# Patient Record
Sex: Female | Born: 1985 | Race: White | Hispanic: No | Marital: Married | Smoking: Never smoker
Health system: Southern US, Community
[De-identification: ages and names within clinical notes are randomized; demographics above are authoritative.]

---

## 2012-02-15 ENCOUNTER — Ambulatory Visit (INDEPENDENT_AMBULATORY_CARE_PROVIDER_SITE_OTHER): Payer: Managed Care, Other (non HMO) | Admitting: General Surgery

## 2012-02-15 ENCOUNTER — Encounter (INDEPENDENT_AMBULATORY_CARE_PROVIDER_SITE_OTHER): Payer: Self-pay | Admitting: General Surgery

## 2012-02-15 VITALS — BP 112/68 | HR 76 | Temp 97.9°F | Resp 20 | Ht 62.0 in | Wt 132.2 lb

## 2012-02-15 DIAGNOSIS — R599 Enlarged lymph nodes, unspecified: Secondary | ICD-10-CM

## 2012-02-15 DIAGNOSIS — R590 Localized enlarged lymph nodes: Secondary | ICD-10-CM

## 2012-02-15 MED ORDER — DOXYCYCLINE HYCLATE 100 MG PO TABS: 100.0000 mg | ORAL_TABLET | Freq: Two times a day (BID) | ORAL | Status: AC

## 2012-02-15 NOTE — Patient Instructions (Signed)
Call in 2 weeks.  If node is still present then call and we will get ultrasound prior to return. If gone will see in office again.

## 2012-02-16 NOTE — Progress Notes (Signed)
Subjective:     Patient ID: Autumn Porter Luster, female   DOB: 1986-05-17, 26 y.o.   MRN: [id number]  HPI 26 yo healthy female who presents with over a one month history of right sided cervical adenopathy.  She was bitten by a tick in the posterior neck right before she noticed this. The tick site is still irritating to her also.  She then felt a couple small nodes in her right neck. She was seen by family medicine (she did not relay to them about the tick bite) and started on augmentin with no improvement.  She does not have any other symptoms including weight loss, loss of appetite, fevers or night sweats.  She was then referred for discussion about possible biopsy.   Review of Systems  Constitutional: Negative for fever, chills and unexpected weight change.  HENT: Negative for hearing loss, congestion, sore throat, trouble swallowing and voice change.   Eyes: Negative for visual disturbance.  Respiratory: Negative for cough and wheezing.   Cardiovascular: Negative for chest pain, palpitations and leg swelling.  Gastrointestinal: Negative for nausea, vomiting, abdominal pain, diarrhea, constipation, blood in stool, abdominal distention and anal bleeding.  Genitourinary: Negative for hematuria, vaginal bleeding and difficulty urinating.  Musculoskeletal: Negative for arthralgias.  Skin: Negative for rash and wound.  Neurological: Negative for seizures, syncope and headaches.  Hematological: Negative for adenopathy. Does not bruise/bleed easily.  Psychiatric/Behavioral: Negative for confusion.       Objective:   Physical Exam  Vitals reviewed. Constitutional: She appears well-developed and well-nourished.  Neck: Neck supple.    Lymphadenopathy:    She has cervical adenopathy.       Assessment:     Tick bite Cervical adenopathy    Plan:     I think historically this is likely related to tick bite.  There is no other evidence this is anything more and I think the nodes are pretty small  (although palpable) in a thin lady.  I think treatment with doxy and then follow up would be best course and she is comfortable with this.

## 2012-03-03 ENCOUNTER — Telehealth (INDEPENDENT_AMBULATORY_CARE_PROVIDER_SITE_OTHER): Payer: Self-pay

## 2012-03-03 NOTE — Telephone Encounter (Signed)
Called pt to let her know that I did speak with Dr Dwain Sarna about the lymph node that she is still feeling after completing the round of Doxycycline. Per Dr Dwain Sarna he recommended pt coming back to see in a couple of weeks. The pt really wants to wait the couple of weeks out and then call to make an appt if not any better. I advised the pt that would be ok to just call me if no change with the area.

## 2012-03-03 NOTE — Telephone Encounter (Signed)
The patient called back to report she finished her antibiotics yesterday and the knot is still there.  She thinks she is supposed to get an ultrasound.  You can call her cell and she is at work so she may not pick up right away.

## 2014-07-12 LAB — OB RESULTS CONSOLE GC/CHLAMYDIA
Chlamydia: NEGATIVE
Gonorrhea: NEGATIVE

## 2014-08-23 LAB — OB RESULTS CONSOLE ABO/RH: RH Type: NEGATIVE

## 2014-08-23 LAB — OB RESULTS CONSOLE RUBELLA ANTIBODY, IGM: Rubella: IMMUNE

## 2014-08-23 LAB — OB RESULTS CONSOLE HIV ANTIBODY (ROUTINE TESTING): HIV: NONREACTIVE

## 2014-08-23 LAB — OB RESULTS CONSOLE RPR: RPR: NONREACTIVE

## 2014-08-23 LAB — OB RESULTS CONSOLE HEPATITIS B SURFACE ANTIGEN: Hepatitis B Surface Ag: NEGATIVE

## 2014-08-23 LAB — OB RESULTS CONSOLE ANTIBODY SCREEN: Antibody Screen: NEGATIVE

## 2015-01-31 LAB — OB RESULTS CONSOLE GBS: STREP GROUP B AG: NEGATIVE

## 2015-03-06 ENCOUNTER — Inpatient Hospital Stay (HOSPITAL_COMMUNITY): Payer: BLUE CROSS/BLUE SHIELD

## 2015-03-06 ENCOUNTER — Inpatient Hospital Stay (HOSPITAL_COMMUNITY)
Admission: AD | Admit: 2015-03-06 | Discharge: 2015-03-06 | Disposition: A | Discharge status: Home / Self Care | Payer: BLUE CROSS/BLUE SHIELD | Source: Ambulatory Visit | Attending: Obstetrics and Gynecology | Admitting: Obstetrics and Gynecology

## 2015-03-06 ENCOUNTER — Encounter (HOSPITAL_COMMUNITY): Payer: Self-pay | Admitting: *Deleted

## 2015-03-06 DIAGNOSIS — O368130 Decreased fetal movements, third trimester, not applicable or unspecified: Secondary | ICD-10-CM | POA: Insufficient documentation

## 2015-03-06 DIAGNOSIS — O368131 Decreased fetal movements, third trimester, fetus 1: Secondary | ICD-10-CM | POA: Diagnosis not present

## 2015-03-06 DIAGNOSIS — Z3A40 40 weeks gestation of pregnancy: Secondary | ICD-10-CM | POA: Diagnosis not present

## 2015-03-06 NOTE — Discharge Instructions (Signed)

## 2015-03-06 NOTE — MAU Provider Note (Signed)
  History     CSN: [id number]  Arrival date and time: 03/06/15 1725   First Provider Initiated Contact with Patient 03/06/15 1811      Chief Complaint  Patient presents with  . Non-stress Test   HPI Comments: Autumn Porter is a 29 y.o. G1P0 at [redacted]w[redacted]d sent from office after nonreactive NST. She reports decreased fetal movement for 2 days. Now she does perceive fetal movement. Aware of mild intermittent contractions. Denies leakage of fluid or vaginal bleeding. Prenatal course has been essentially uncomplicated.       History reviewed. No pertinent past medical history.  History reviewed. No pertinent past surgical history.  Family History  Problem Relation Age of Onset  . Cancer Paternal Grandmother     breast    Social History  Substance Use Topics  . Smoking status: Never Smoker   . Smokeless tobacco: Never Used  . Alcohol Use: 2.0 oz/week    4 Standard drinks or equivalent per week    Allergies: No Known Allergies  No prescriptions prior to admission    Review of Systems  Constitutional: Negative for fever.  Gastrointestinal: Negative for nausea, vomiting and abdominal pain.  Genitourinary: Negative for dysuria and urgency.  Neurological: Negative for dizziness and headaches.  Psychiatric/Behavioral: The patient is not nervous/anxious.    Physical Exam   Filed Vitals:   03/06/15 1958  BP: 114/79  Pulse: 82  Temp: 97.6 F (36.4 C)  TempSrc: Oral  Resp: 20  SpO2: 100%    Physical Exam  Constitutional: She is oriented to person, place, and time. She appears well-developed and well-nourished.  Neck: Normal range of motion. Neck supple.  Cardiovascular: Normal rate.   Respiratory: Effort normal.  GI: Soft.  Genitourinary:  Exam deferred. States last VE 1 wk ago: 1 cm.   Musculoskeletal: She exhibits no edema.  Neurological: She is alert and oriented to person, place, and time.  Skin: Skin is warm and dry.  Psychiatric: She has a normal mood and  affect. Her behavior is normal. Thought content normal.    MAU Course  Procedures   [place] for AFI: prelim report AFI 15 No results found for this or any previous visit (from the past 24 hour(s)). MDM C/W Dr. Tenny Craw  Assessment and Plan  G1 at 40wk Reassuring fetal testing 1. [redacted] weeks gestation of pregnancy   2. Decreased fetal movement, third trimester, fetus 1      Medication List    TAKE these medications        famotidine 20 MG tablet  Commonly known as:  PEPCID  Take 20 mg by mouth daily.     metroNIDAZOLE 500 MG tablet  Commonly known as:  FLAGYL  Take 500 mg by mouth 2 (two) times daily. Take 1 tablet by mouth 2 times a day for seven days.     prenatal multivitamin Tabs tablet  Take 1 tablet by mouth daily at 12 noon.       Follow-up Information    Follow up with Levi Aland, MD.   Specialty:  Obstetrics and Gynecology   Why:  Keep your scheduled prenatal appointment   Contact information:   [address] [place] [address] [phone]       POE,DEIRDRE 03/06/2015, 6:19 PM the patient is sent in a prescription for herhe also never done before that

## 2015-03-06 NOTE — MAU Note (Signed)
Pt sent in for NST from office for post dates. Nonreactive in office. Stated baby had not been moving as much for the past few days.

## 2015-03-13 ENCOUNTER — Inpatient Hospital Stay (HOSPITAL_COMMUNITY): Payer: BLUE CROSS/BLUE SHIELD | Admitting: Anesthesiology

## 2015-03-13 ENCOUNTER — Inpatient Hospital Stay (HOSPITAL_COMMUNITY)
Admission: RE | Admit: 2015-03-13 | Discharge: 2015-03-16 | DRG: 766 | Disposition: A | Discharge status: Home / Self Care | Payer: BLUE CROSS/BLUE SHIELD | Source: Ambulatory Visit | Attending: Obstetrics and Gynecology | Admitting: Obstetrics and Gynecology

## 2015-03-13 ENCOUNTER — Encounter (HOSPITAL_COMMUNITY): Payer: Self-pay | Admitting: *Deleted

## 2015-03-13 DIAGNOSIS — O9962 Diseases of the digestive system complicating childbirth: Secondary | ICD-10-CM | POA: Diagnosis present

## 2015-03-13 DIAGNOSIS — Z3490 Encounter for supervision of normal pregnancy, unspecified, unspecified trimester: Secondary | ICD-10-CM

## 2015-03-13 DIAGNOSIS — Z3A40 40 weeks gestation of pregnancy: Secondary | ICD-10-CM | POA: Diagnosis present

## 2015-03-13 DIAGNOSIS — K219 Gastro-esophageal reflux disease without esophagitis: Secondary | ICD-10-CM | POA: Diagnosis present

## 2015-03-13 LAB — CBC
HCT: 39 % (ref 36.0–46.0)
Hemoglobin: 13.6 g/dL (ref 12.0–15.0)
MCH: 31.3 pg (ref 26.0–34.0)
MCHC: 34.9 g/dL (ref 30.0–36.0)
MCV: 89.9 fL (ref 78.0–100.0)
Platelets: 186 K/uL (ref 150–400)
RBC: 4.34 MIL/uL (ref 3.87–5.11)
RDW: 14.2 % (ref 11.5–15.5)
WBC: 10.1 K/uL (ref 4.0–10.5)

## 2015-03-13 LAB — SYPHILIS: RPR W/REFLEX TO RPR TITER AND TREPONEMAL ANTIBODIES, TRADITIONAL SCREENING AND DIAGNOSIS ALGORITHM: RPR Ser Ql: NONREACTIVE

## 2015-03-13 MED ORDER — ACETAMINOPHEN 325 MG PO TABS: 650.0000 mg | ORAL_TABLET | ORAL | Status: DC | PRN

## 2015-03-13 MED ORDER — OXYTOCIN 40 UNITS IN LACTATED RINGERS INFUSION - SIMPLE MED
1.0000 m[IU]/min | INTRAVENOUS | Status: DC
Start: 2015-03-13 — End: 2015-03-13
  Administered 2015-03-13: 2 m[IU]/min via INTRAVENOUS
  Filled 2015-03-13: qty 1000

## 2015-03-13 MED ORDER — OXYTOCIN BOLUS FROM INFUSION: 500.0000 mL | INTRAVENOUS | Status: DC

## 2015-03-13 MED ORDER — LIDOCAINE HCL (PF) 1 % IJ SOLN: 30.0000 mL | INTRAMUSCULAR | Status: DC | PRN

## 2015-03-13 MED ORDER — FLEET ENEMA 7-19 GM/118ML RE ENEM: 1.0000 | ENEMA | RECTAL | Status: DC | PRN

## 2015-03-13 MED ORDER — OXYTOCIN 40 UNITS IN LACTATED RINGERS INFUSION - SIMPLE MED: 1.0000 m[IU]/min | INTRAVENOUS | Status: DC

## 2015-03-13 MED ORDER — PHENYLEPHRINE 40 MCG/ML (10ML) SYRINGE FOR IV PUSH (FOR BLOOD PRESSURE SUPPORT)
80.0000 ug | PREFILLED_SYRINGE | INTRAVENOUS | Status: AC | PRN
  Administered 2015-03-14: 40 ug via INTRAVENOUS
  Administered 2015-03-14 (×2): 80 ug via INTRAVENOUS
  Administered 2015-03-14: 40 ug via INTRAVENOUS
  Administered 2015-03-14 (×2): 80 ug via INTRAVENOUS
  Administered 2015-03-14: 40 ug via INTRAVENOUS
  Administered 2015-03-14 (×2): 80 ug via INTRAVENOUS
  Administered 2015-03-14: 40 ug via INTRAVENOUS
  Administered 2015-03-14 (×3): 80 ug via INTRAVENOUS

## 2015-03-13 MED ORDER — ONDANSETRON HCL 4 MG/2ML IJ SOLN: 4.0000 mg | Freq: Four times a day (QID) | INTRAMUSCULAR | Status: DC | PRN

## 2015-03-13 MED ORDER — PHENYLEPHRINE 40 MCG/ML (10ML) SYRINGE FOR IV PUSH (FOR BLOOD PRESSURE SUPPORT)
PREFILLED_SYRINGE | INTRAVENOUS | Status: AC
  Filled 2015-03-13: qty 20

## 2015-03-13 MED ORDER — SODIUM CHLORIDE 0.9 % IJ SOLN: 3.0000 mL | Freq: Two times a day (BID) | INTRAMUSCULAR | Status: DC

## 2015-03-13 MED ORDER — LACTATED RINGERS IV SOLN
500.0000 mL | INTRAVENOUS | Status: DC | PRN
  Administered 2015-03-13 (×4): 500 mL via INTRAVENOUS

## 2015-03-13 MED ORDER — SODIUM CHLORIDE 0.9 % IV SOLN: 250.0000 mL | INTRAVENOUS | Status: DC | PRN

## 2015-03-13 MED ORDER — FENTANYL 2.5 MCG/ML BUPIVACAINE 1/10 % EPIDURAL INFUSION (WH - ANES)
14.0000 mL/h | INTRAMUSCULAR | Status: DC | PRN
  Administered 2015-03-13 – 2015-03-14 (×3): 14 mL/h via EPIDURAL
  Filled 2015-03-13: qty 125

## 2015-03-13 MED ORDER — DIPHENHYDRAMINE HCL 50 MG/ML IJ SOLN: 12.5000 mg | INTRAMUSCULAR | Status: DC | PRN

## 2015-03-13 MED ORDER — BUTORPHANOL TARTRATE 1 MG/ML IJ SOLN
1.0000 mg | INTRAMUSCULAR | Status: DC | PRN
Start: 2015-03-13 — End: 2015-03-14
  Administered 2015-03-13: 1 mg via INTRAVENOUS
  Filled 2015-03-13: qty 1

## 2015-03-13 MED ORDER — SODIUM CHLORIDE 0.9 % IJ SOLN: 3.0000 mL | INTRAMUSCULAR | Status: DC | PRN

## 2015-03-13 MED ORDER — OXYCODONE-ACETAMINOPHEN 5-325 MG PO TABS: 1.0000 | ORAL_TABLET | ORAL | Status: DC | PRN

## 2015-03-13 MED ORDER — LIDOCAINE HCL (PF) 1 % IJ SOLN
INTRAMUSCULAR | Status: DC | PRN
  Administered 2015-03-13: 5 mL via EPIDURAL
  Administered 2015-03-13: 3 mL via EPIDURAL
  Administered 2015-03-13: 2 mL via EPIDURAL

## 2015-03-13 MED ORDER — OXYCODONE-ACETAMINOPHEN 5-325 MG PO TABS: 2.0000 | ORAL_TABLET | ORAL | Status: DC | PRN

## 2015-03-13 MED ORDER — OXYTOCIN 40 UNITS IN LACTATED RINGERS INFUSION - SIMPLE MED
62.5000 mL/h | INTRAVENOUS | Status: DC
  Filled 2015-03-13: qty 1000

## 2015-03-13 MED ORDER — TERBUTALINE SULFATE 1 MG/ML IJ SOLN: 0.2500 mg | Freq: Once | INTRAMUSCULAR | Status: DC | PRN

## 2015-03-13 MED ORDER — MISOPROSTOL 25 MCG QUARTER TABLET
25.0000 ug | ORAL_TABLET | ORAL | Status: DC | PRN
  Administered 2015-03-13: 25 ug via VAGINAL
  Filled 2015-03-13: qty 0.25

## 2015-03-13 MED ORDER — LACTATED RINGERS IV SOLN
INTRAVENOUS | Status: DC
  Administered 2015-03-13 (×4): via INTRAVENOUS

## 2015-03-13 MED ORDER — FENTANYL 2.5 MCG/ML BUPIVACAINE 1/10 % EPIDURAL INFUSION (WH - ANES)
INTRAMUSCULAR | Status: AC
  Administered 2015-03-13: 14 mL/h via EPIDURAL
  Filled 2015-03-13: qty 125

## 2015-03-13 MED ORDER — CITRIC ACID-SODIUM CITRATE 334-500 MG/5ML PO SOLN
30.0000 mL | ORAL | Status: DC | PRN
Start: 2015-03-13 — End: 2015-03-14
  Administered 2015-03-14: 30 mL via ORAL
  Filled 2015-03-13: qty 15

## 2015-03-13 MED ORDER — EPHEDRINE 5 MG/ML INJ
10.0000 mg | INTRAVENOUS | Status: DC | PRN
Start: 2015-03-13 — End: 2015-03-14

## 2015-03-13 NOTE — H&P (Signed)
29 y.o. [redacted]w[redacted]d  G1P0 comes in for scheduled 41 week IOL.  Otherwise has good fetal movement and no bleeding.  History reviewed. No pertinent past medical history. History reviewed. No pertinent past surgical history.  OB History  Gravida Para Term Preterm AB SAB TAB Ectopic Multiple Living  1             # Outcome Date GA Lbr Len/2nd Weight Sex Delivery Anes PTL Lv  1 Current               Social History   Social History  . Marital Status: Married    Spouse Name: N/A  . Number of Children: N/A  . Years of Education: N/A   Occupational History  . Not on file.   Social History Main Topics  . Smoking status: Never Smoker   . Smokeless tobacco: Never Used  . Alcohol Use: 2.0 oz/week    4 Standard drinks or equivalent per week  . Drug Use: No  . Sexual Activity:    Partners: Male    Birth Control/ Protection: Pill   Other Topics Concern  . Not on file   Social History Narrative   Review of patient's allergies indicates no known allergies.    Prenatal Transfer Tool  Maternal Diabetes: No Genetic Screening: Normal Maternal Ultrasounds/Referrals: Normal Fetal Ultrasounds or other Referrals:  None Maternal Substance Abuse:  No Significant Maternal Medications:  None Significant Maternal Lab Results: Lab values include: Group B Strep negative  Other PNC: uncomplicated except for genital warts    Filed Vitals:   03/13/15 2041  BP:   Pulse: 71  Temp:   Resp:      Lungs/Cor:  NAD Abdomen:  soft, gravid Ex:  no cords, erythema SVE:  1/L/-3 at admission FHTs:  135, good STV, NST R Toco:  irreg   A/P  Admitted by MA overnight for schedule IOL 41 weeks  GBS Neg  Pt received 1 dose of cytotec, ctx too frequently for add'l cytotec, pitocin start this am, pt spontaneously ruptured around 1pm.  Has now progressed to 7-8/80/0.  Had prolonged decel and recurrent late decels recently.  Pitocin discontinued, O2 given, position changes and tracing improved.  IUPC placed.   Will monitor and restart pitocin as tolerated.  Philip Aspen

## 2015-03-13 NOTE — Anesthesia Preprocedure Evaluation (Addendum)
Anesthesia Evaluation  Patient identified by MRN, date of birth, ID band Patient awake    Reviewed: Allergy & Precautions, NPO status , Patient's Chart, lab work & pertinent test results  Airway Mallampati: III  TM Distance: >3 FB Neck ROM: Full    Dental  (+) Teeth Intact, Dental Advisory Given   Pulmonary neg pulmonary ROS,  breath sounds clear to auscultation  Pulmonary exam normal       Cardiovascular Exercise Tolerance: Good negative cardio ROS Normal cardiovascular examRhythm:Regular Rate:Normal     Neuro/Psych negative neurological ROS  negative psych ROS   GI/Hepatic Neg liver ROS, GERD-  Medicated and Controlled,  Endo/Other  negative endocrine ROS  Renal/GU negative Renal ROS     Musculoskeletal negative musculoskeletal ROS (+)   Abdominal   Peds  Hematology negative hematology ROS (+)   Anesthesia Other Findings Day of surgery medications reviewed with the patient.  Reproductive/Obstetrics (+) Pregnancy                             Anesthesia Physical Anesthesia Plan  ASA: II  Anesthesia Plan: Epidural   Post-op Pain Management:    Induction:   Airway Management Planned: Nasal Cannula  Additional Equipment:   Intra-op Plan:   Post-operative Plan:   Informed Consent: I have reviewed the patients History and Physical, chart, labs and discussed the procedure including the risks, benefits and alternatives for the proposed anesthesia with the patient or authorized representative who has indicated his/her understanding and acceptance.   Dental advisory given  Plan Discussed with: CRNA and Surgeon  Anesthesia Plan Comments: (Patient identified. Risks/Benefits/Options discussed with patient including but not limited to bleeding, infection, nerve damage, paralysis, failed block, incomplete pain control, headache, blood pressure changes, nausea, vomiting, reactions to  medication both or allergic, itching and postpartum back pain. Confirmed with bedside nurse the patient's most recent platelet count. Confirmed with patient that they are not currently taking any anticoagulation, have any bleeding history or any family history of bleeding disorders. Patient expressed understanding and wished to proceed. All questions were answered. )       Anesthesia Quick Evaluation

## 2015-03-13 NOTE — Progress Notes (Signed)
Callahan, DO, updated on FHR status, SVE and MVU's. Orders received to restart pitocin at 2 milliunits if the baby looks reassuring.

## 2015-03-13 NOTE — Anesthesia Procedure Notes (Signed)
Epidural Patient location during procedure: OB  Staffing Anesthesiologist: TURK, STEPHEN EDWARD Performed by: anesthesiologist   Preanesthetic Checklist Completed: patient identified, pre-op evaluation, timeout performed, IV checked, risks and benefits discussed and monitors and equipment checked  Epidural Patient position: sitting Prep: DuraPrep Patient monitoring: blood pressure and continuous pulse ox Approach: midline Location: L3-L4 Injection technique: LOR air  Needle:  Needle type: Tuohy  Needle gauge: 17 G Needle length: 9 cm Catheter size: 19 Gauge Catheter at skin depth: 14 cm Test dose: negative and Other (1% Lidocaine)  Additional Notes Patient identified.  Risk benefits discussed including failed block, incomplete pain control, headache, nerve damage, paralysis, blood pressure changes, nausea, vomiting, reactions to medication both toxic or allergic, and postpartum back pain.  Patient expressed understanding and wished to proceed.  All questions were answered.  Sterile technique used throughout procedure and epidural site dressed with sterile barrier dressing. No paresthesia or other complications noted. The patient did not experience any signs of intravascular injection such as tinnitus or metallic taste in mouth nor signs of intrathecal spread such as rapid motor block. Please see nursing notes for vital signs. Reason for block:procedure for pain   

## 2015-03-14 ENCOUNTER — Encounter (HOSPITAL_COMMUNITY): Payer: Self-pay

## 2015-03-14 ENCOUNTER — Encounter (HOSPITAL_COMMUNITY)
Admission: RE | Disposition: A | Discharge status: Home / Self Care | Payer: Self-pay | Source: Ambulatory Visit | Attending: Obstetrics and Gynecology

## 2015-03-14 SURGERY — Surgical Case
Anesthesia: Epidural

## 2015-03-14 MED ORDER — NALOXONE HCL 0.4 MG/ML IJ SOLN: 0.4000 mg | INTRAMUSCULAR | Status: DC | PRN

## 2015-03-14 MED ORDER — NALBUPHINE HCL 10 MG/ML IJ SOLN: 5.0000 mg | Freq: Once | INTRAMUSCULAR | Status: DC | PRN

## 2015-03-14 MED ORDER — TETANUS-DIPHTH-ACELL PERTUSSIS 5-2.5-18.5 LF-MCG/0.5 IM SUSP: 0.5000 mL | Freq: Once | INTRAMUSCULAR | Status: DC

## 2015-03-14 MED ORDER — ZOLPIDEM TARTRATE 5 MG PO TABS: 5.0000 mg | ORAL_TABLET | Freq: Every evening | ORAL | Status: DC | PRN

## 2015-03-14 MED ORDER — NALOXONE HCL 1 MG/ML IJ SOLN
1.0000 ug/kg/h | INTRAVENOUS | Status: DC | PRN
  Filled 2015-03-14: qty 2

## 2015-03-14 MED ORDER — ONDANSETRON HCL 4 MG/2ML IJ SOLN
INTRAMUSCULAR | Status: AC
  Filled 2015-03-14: qty 2

## 2015-03-14 MED ORDER — IBUPROFEN 600 MG PO TABS
600.0000 mg | ORAL_TABLET | Freq: Four times a day (QID) | ORAL | Status: DC
  Administered 2015-03-14 – 2015-03-16 (×9): 600 mg via ORAL
  Filled 2015-03-14 (×9): qty 1

## 2015-03-14 MED ORDER — DIBUCAINE 1 % RE OINT
1.0000 | TOPICAL_OINTMENT | RECTAL | Status: DC | PRN
Start: 2015-03-14 — End: 2015-03-16

## 2015-03-14 MED ORDER — CEFAZOLIN SODIUM-DEXTROSE 2-3 GM-% IV SOLR
2.0000 g | Freq: Once | INTRAVENOUS | Status: DC
  Filled 2015-03-14: qty 50

## 2015-03-14 MED ORDER — PHENYLEPHRINE 40 MCG/ML (10ML) SYRINGE FOR IV PUSH (FOR BLOOD PRESSURE SUPPORT)
PREFILLED_SYRINGE | INTRAVENOUS | Status: AC
  Filled 2015-03-14: qty 30

## 2015-03-14 MED ORDER — OXYTOCIN 10 UNIT/ML IJ SOLN
40.0000 [IU] | INTRAMUSCULAR | Status: DC | PRN
  Administered 2015-03-14: 40 [IU] via INTRAVENOUS

## 2015-03-14 MED ORDER — PHENYLEPHRINE 8 MG IN D5W 100 ML (0.08MG/ML) PREMIX OPTIME
INJECTION | INTRAVENOUS | Status: DC | PRN
  Administered 2015-03-14: 80 ug/min via INTRAVENOUS

## 2015-03-14 MED ORDER — NALBUPHINE HCL 10 MG/ML IJ SOLN: 5.0000 mg | INTRAMUSCULAR | Status: DC | PRN

## 2015-03-14 MED ORDER — DIPHENHYDRAMINE HCL 25 MG PO CAPS: 25.0000 mg | ORAL_CAPSULE | ORAL | Status: DC | PRN

## 2015-03-14 MED ORDER — SODIUM CHLORIDE 0.9 % IJ SOLN: 3.0000 mL | INTRAMUSCULAR | Status: DC | PRN

## 2015-03-14 MED ORDER — LACTATED RINGERS IV SOLN
INTRAVENOUS | Status: DC
  Administered 2015-03-14: 09:00:00 via INTRAVENOUS

## 2015-03-14 MED ORDER — DIPHENHYDRAMINE HCL 50 MG/ML IJ SOLN: 12.5000 mg | INTRAMUSCULAR | Status: DC | PRN

## 2015-03-14 MED ORDER — PRENATAL MULTIVITAMIN CH
1.0000 | ORAL_TABLET | Freq: Every day | ORAL | Status: DC
  Administered 2015-03-14 – 2015-03-16 (×3): 1 via ORAL
  Filled 2015-03-14 (×3): qty 1

## 2015-03-14 MED ORDER — LACTATED RINGERS IV SOLN
INTRAVENOUS | Status: DC | PRN
  Administered 2015-03-14 (×3): via INTRAVENOUS

## 2015-03-14 MED ORDER — MEPERIDINE HCL 25 MG/ML IJ SOLN
INTRAMUSCULAR | Status: AC
  Filled 2015-03-14: qty 1

## 2015-03-14 MED ORDER — PHENYLEPHRINE 40 MCG/ML (10ML) SYRINGE FOR IV PUSH (FOR BLOOD PRESSURE SUPPORT)
PREFILLED_SYRINGE | INTRAVENOUS | Status: AC
  Filled 2015-03-14: qty 20

## 2015-03-14 MED ORDER — MEPERIDINE HCL 25 MG/ML IJ SOLN
INTRAMUSCULAR | Status: DC | PRN
  Administered 2015-03-14 (×2): 12.5 mg via INTRAVENOUS

## 2015-03-14 MED ORDER — ONDANSETRON HCL 4 MG/2ML IJ SOLN
INTRAMUSCULAR | Status: DC | PRN
  Administered 2015-03-14: 4 mg via INTRAVENOUS

## 2015-03-14 MED ORDER — SODIUM BICARBONATE 8.4 % IV SOLN
INTRAVENOUS | Status: DC | PRN
  Administered 2015-03-14 (×4): 5 mL via EPIDURAL

## 2015-03-14 MED ORDER — LACTATED RINGERS IV BOLUS (SEPSIS)
1000.0000 mL | Freq: Once | INTRAVENOUS | Status: AC
  Administered 2015-03-14: 1000 mL via INTRAVENOUS

## 2015-03-14 MED ORDER — WITCH HAZEL-GLYCERIN EX PADS: 1.0000 "application " | MEDICATED_PAD | CUTANEOUS | Status: DC | PRN

## 2015-03-14 MED ORDER — OXYTOCIN 40 UNITS IN LACTATED RINGERS INFUSION - SIMPLE MED: 62.5000 mL/h | INTRAVENOUS | Status: AC

## 2015-03-14 MED ORDER — OXYCODONE-ACETAMINOPHEN 5-325 MG PO TABS: 1.0000 | ORAL_TABLET | ORAL | Status: DC | PRN

## 2015-03-14 MED ORDER — MORPHINE SULFATE 0.5 MG/ML IJ SOLN
INTRAMUSCULAR | Status: AC
  Filled 2015-03-14: qty 100

## 2015-03-14 MED ORDER — SCOPOLAMINE 1 MG/3DAYS TD PT72
MEDICATED_PATCH | TRANSDERMAL | Status: DC | PRN
  Administered 2015-03-14: 1 via TRANSDERMAL

## 2015-03-14 MED ORDER — SIMETHICONE 80 MG PO CHEW
80.0000 mg | CHEWABLE_TABLET | ORAL | Status: DC
  Administered 2015-03-14 – 2015-03-16 (×2): 80 mg via ORAL
  Filled 2015-03-14 (×2): qty 1

## 2015-03-14 MED ORDER — DIPHENHYDRAMINE HCL 25 MG PO CAPS: 25.0000 mg | ORAL_CAPSULE | Freq: Four times a day (QID) | ORAL | Status: DC | PRN

## 2015-03-14 MED ORDER — SENNOSIDES-DOCUSATE SODIUM 8.6-50 MG PO TABS
2.0000 | ORAL_TABLET | ORAL | Status: DC
  Administered 2015-03-14 – 2015-03-16 (×2): 2 via ORAL
  Filled 2015-03-14 (×2): qty 2

## 2015-03-14 MED ORDER — SIMETHICONE 80 MG PO CHEW
80.0000 mg | CHEWABLE_TABLET | Freq: Three times a day (TID) | ORAL | Status: DC
  Administered 2015-03-14 – 2015-03-16 (×6): 80 mg via ORAL
  Filled 2015-03-14 (×6): qty 1

## 2015-03-14 MED ORDER — OXYTOCIN 10 UNIT/ML IJ SOLN
INTRAMUSCULAR | Status: AC
  Filled 2015-03-14: qty 4

## 2015-03-14 MED ORDER — CEFAZOLIN SODIUM-DEXTROSE 2-3 GM-% IV SOLR
INTRAVENOUS | Status: DC | PRN
  Administered 2015-03-14: 2 g via INTRAVENOUS

## 2015-03-14 MED ORDER — LANOLIN HYDROUS EX OINT: 1.0000 "application " | TOPICAL_OINTMENT | CUTANEOUS | Status: DC | PRN

## 2015-03-14 MED ORDER — KETOROLAC TROMETHAMINE 30 MG/ML IJ SOLN: 30.0000 mg | Freq: Four times a day (QID) | INTRAMUSCULAR | Status: AC | PRN

## 2015-03-14 MED ORDER — SCOPOLAMINE 1 MG/3DAYS TD PT72
MEDICATED_PATCH | TRANSDERMAL | Status: AC
  Filled 2015-03-14: qty 1

## 2015-03-14 MED ORDER — MEPERIDINE HCL 25 MG/ML IJ SOLN: 6.2500 mg | INTRAMUSCULAR | Status: DC | PRN

## 2015-03-14 MED ORDER — MENTHOL 3 MG MT LOZG: 1.0000 | LOZENGE | OROMUCOSAL | Status: DC | PRN

## 2015-03-14 MED ORDER — ONDANSETRON HCL 4 MG/2ML IJ SOLN: 4.0000 mg | Freq: Three times a day (TID) | INTRAMUSCULAR | Status: DC | PRN

## 2015-03-14 MED ORDER — ACETAMINOPHEN 325 MG PO TABS: 650.0000 mg | ORAL_TABLET | ORAL | Status: DC | PRN

## 2015-03-14 MED ORDER — OXYCODONE-ACETAMINOPHEN 5-325 MG PO TABS: 2.0000 | ORAL_TABLET | ORAL | Status: DC | PRN

## 2015-03-14 MED ORDER — PHENYLEPHRINE 8 MG IN D5W 100 ML (0.08MG/ML) PREMIX OPTIME
INJECTION | INTRAVENOUS | Status: AC
  Filled 2015-03-14: qty 100

## 2015-03-14 MED ORDER — SIMETHICONE 80 MG PO CHEW: 80.0000 mg | CHEWABLE_TABLET | ORAL | Status: DC | PRN

## 2015-03-14 MED ORDER — SCOPOLAMINE 1 MG/3DAYS TD PT72
1.0000 | MEDICATED_PATCH | Freq: Once | TRANSDERMAL | Status: DC
  Filled 2015-03-14: qty 1

## 2015-03-14 SURGICAL SUPPLY — 31 items
CLAMP CORD UMBIL (MISCELLANEOUS) IMPLANT
CLOTH BEACON ORANGE TIMEOUT ST (SAFETY) ×3 IMPLANT
DRAPE SHEET LG 3/4 BI-LAMINATE (DRAPES) IMPLANT
DRSG OPSITE POSTOP 4X10 (GAUZE/BANDAGES/DRESSINGS) ×3 IMPLANT
DURAPREP 26ML APPLICATOR (WOUND CARE) ×3 IMPLANT
ELECT REM PT RETURN 9FT ADLT (ELECTROSURGICAL) ×3
ELECTRODE REM PT RTRN 9FT ADLT (ELECTROSURGICAL) ×1 IMPLANT
EXTRACTOR VACUUM M CUP 4 TUBE (SUCTIONS) IMPLANT
EXTRACTOR VACUUM M CUP 4' TUBE (SUCTIONS)
GLOVE BIOGEL PI IND STRL 6.5 (GLOVE) ×1 IMPLANT
GLOVE BIOGEL PI INDICATOR 6.5 (GLOVE) ×2
GLOVE ECLIPSE 6.5 STRL STRAW (GLOVE) ×3 IMPLANT
GOWN STRL REUS W/TWL LRG LVL3 (GOWN DISPOSABLE) ×6 IMPLANT
KIT ABG SYR 3ML LUER SLIP (SYRINGE) IMPLANT
NEEDLE HYPO 25X5/8 SAFETYGLIDE (NEEDLE) IMPLANT
NS IRRIG 1000ML POUR BTL (IV SOLUTION) ×3 IMPLANT
PACK C SECTION WH (CUSTOM PROCEDURE TRAY) ×3 IMPLANT
PAD ABD 7.5X8 STRL (GAUZE/BANDAGES/DRESSINGS) IMPLANT
PAD OB MATERNITY 4.3X12.25 (PERSONAL CARE ITEMS) ×3 IMPLANT
RTRCTR C-SECT PINK 25CM LRG (MISCELLANEOUS) ×3 IMPLANT
SPONGE LAP 18X18 X RAY DECT (DISPOSABLE) ×3 IMPLANT
STAPLER VISISTAT 35W (STAPLE) ×3 IMPLANT
SUT MON AB 2-0 CT1 27 (SUTURE) ×3 IMPLANT
SUT MON AB 4-0 PS1 27 (SUTURE) IMPLANT
SUT PDS AB 0 CTX 60 (SUTURE) IMPLANT
SUT PLAIN 2 0 XLH (SUTURE) IMPLANT
SUT VIC AB 0 CTX 36 (SUTURE) ×10
SUT VIC AB 0 CTX36XBRD ANBCTRL (SUTURE) ×5 IMPLANT
SUT VIC AB 4-0 KS 27 (SUTURE) IMPLANT
TOWEL OR 17X24 6PK STRL BLUE (TOWEL DISPOSABLE) ×3 IMPLANT
TRAY FOLEY CATH SILVER 14FR (SET/KITS/TRAYS/PACK) ×3 IMPLANT

## 2015-03-14 NOTE — Lactation Note (Signed)
This note was copied from the chart of Autumn Porter. Lactation Consultation Note Initial visit at 14 hours of age.  Mom reports baby has been sleepy and not latching, she has begun using a #20 NS.  Mom has short flat nipple pink in color.  Mom has easily expressed colostrum and compressible breast tissue.  Baby is sleepy and not eager to latch.  Attempted with #24 NS with better fit at this time, baby does not suck.  Hand expressed drops to baby's mouth and worked on positioning.  Discussed with mom cautions of using NS if needed and using a DEBP if she continues to need to use NS for latch.   [facility] LC resources given and discussed.  Encouraged to feed with early cues on demand.  Early newborn behavior discussed.  Mom to call for assist as needed.    Patient Name: Autumn Porter [id number] Date: 03/14/2015 Reason for consult: Initial assessment   Maternal Data    Feeding Feeding Type: Breast Fed  LATCH Score/Interventions Latch: Too sleepy or reluctant, no latch achieved, no sucking elicited. Intervention(s): Skin to skin;Teach feeding cues;Waking techniques Intervention(s): Assist with latch;Breast massage;Breast compression  Audible Swallowing: None  Type of Nipple: Flat Intervention(s): Hand pump  Comfort (Breast/Nipple): Soft / non-tender     Hold (Positioning): Assistance needed to correctly position infant at breast and maintain latch. Intervention(s): Breastfeeding basics reviewed;Support Pillows;Position options;Skin to skin  LATCH Score: 4  Lactation Tools Discussed/Used Tools: Nipple Shields Nipple shield size: 24 Initiated by:: RN Date initiated:: 03/14/15   Consult Status Consult Status: Follow-up Date: 03/15/15 Follow-up type: In-patient    Jannifer Rodney 03/14/2015, 5:26 PM

## 2015-03-14 NOTE — Anesthesia Postprocedure Evaluation (Signed)
  Anesthesia Post-op Note  Patient: Autumn Porter  Procedure(s) Performed: Procedure(s): CESAREAN SECTION (N/A)  Patient Location: PACU  Anesthesia Type:Epidural  Level of Consciousness: awake  Airway and Oxygen Therapy: Patient Spontanous Breathing  Post-op Pain: mild  Post-op Assessment: Post-op Vital signs reviewed, Patient's Cardiovascular Status Stable, Respiratory Function Stable, Patent Airway, No signs of Nausea or vomiting and Pain level controlled LLE Motor Response: Purposeful movement LLE Sensation: Increased RLE Motor Response: Purposeful movement RLE Sensation: Increased      Post-op Vital Signs: Reviewed and stable  Last Vitals:  Filed Vitals:   03/14/15 0538  BP: 82/62  Pulse: 104  Temp: 37.2 C  Resp: 24    Complications: No apparent anesthesia complications

## 2015-03-14 NOTE — Transfer of Care (Signed)
Immediate Anesthesia Transfer of Care Note  Patient: Autumn Porter  Procedure(s) Performed: Procedure(s): CESAREAN SECTION (N/A)  Patient Location: PACU  Anesthesia Type:Epidural  Level of Consciousness: awake, alert  and oriented  Airway & Oxygen Therapy: Patient Spontanous Breathing  Post-op Assessment: Report given to RN  Post vital signs: Reviewed and stable  Last Vitals:  Filed Vitals:   03/14/15 0230  BP: 116/66  Pulse: 81  Temp:   Resp:     Complications: No apparent anesthesia complications

## 2015-03-14 NOTE — Brief Op Note (Signed)
03/13/2015 - 03/14/2015  3:49 AM  PATIENT:  Autumn Porter  29 y.o. female  PRE-OPERATIVE DIAGNOSIS:  Arrest of dilation, NRFHT  POST-OPERATIVE DIAGNOSIS: same  PROCEDURE:  Procedure(s): CESAREAN SECTION (N/A)  SURGEON:  Surgeon(s) and Role:    Philip Aspen, DO - Primary  ANESTHESIA:   epidural  EBL:  Total I/O In: 3000 [I.V.:3000] Out: 1350 [Urine:150; Blood:1200]  SPECIMEN: cord blood for lab and for commercial banking, placenta  FINDINGS: female infant, cephalic APGARS 9,9, weight 7#11, normal uterus, tubes, ovaries bilaterally  DISPOSITION OF SPECIMEN:  PATHOLOGY  COUNTS:  YES  PLAN OF CARE: Admit to inpatient   PATIENT DISPOSITION:  PACU - hemodynamically stable.   Delay start of Pharmacological VTE agent (>24hrs) due to surgical blood loss or risk of bleeding: not applicable

## 2015-03-14 NOTE — Addendum Note (Signed)
Addendum  created 03/14/15 0839 by Earmon Phoenix, CRNA   Modules edited: Notes Section   Notes Section:  File: [id number]

## 2015-03-14 NOTE — Addendum Note (Signed)
Addendum  created 03/14/15 0636 by Corky Sox, MD   Modules edited: Anesthesia Review and Sign Navigator Section, Clinical Notes   Clinical Notes:  File: [id number]

## 2015-03-14 NOTE — Progress Notes (Signed)
Called by RN approx 2:15am alerting me that fetal decelerations resumed and pit was at 2, she discontinued pitocin and did intrauterine resusitation with improvement.  Pt remained 9cm with active phase protraction noted.  Discussed with patient and decision made to proceed for c/s.  Consent obtained.

## 2015-03-14 NOTE — Anesthesia Postprocedure Evaluation (Signed)
  Anesthesia Post-op Note  Patient: Autumn Porter  Procedure(s) Performed: Procedure(s): CESAREAN SECTION (N/A)  Patient Location: Mother/Baby  Anesthesia Type:Epidural  Level of Consciousness: awake, alert , oriented and patient cooperative  Airway and Oxygen Therapy: Patient Spontanous Breathing  Post-op Pain: mild  Post-op Assessment: Patient's Cardiovascular Status Stable, Respiratory Function Stable, No headache, No backache and Patient able to bend at knees      Post-op Vital Signs: stable  Last Vitals:  Filed Vitals:   03/14/15 0730  BP: 93/53  Pulse: 103  Temp: 36.8 C  Resp: 20    Complications: No apparent anesthesia complications

## 2015-03-15 ENCOUNTER — Encounter (HOSPITAL_COMMUNITY): Payer: Self-pay | Admitting: Obstetrics and Gynecology

## 2015-03-15 LAB — TYPE AND SCREEN
ABO/RH(D): A NEG
Antibody Screen: POSITIVE
DAT, IgG: NEGATIVE
UNIT DIVISION: 0
Unit division: 0

## 2015-03-15 LAB — CBC
HEMATOCRIT: 23.2 % — AB (ref 36.0–46.0)
HEMOGLOBIN: 7.7 g/dL — AB (ref 12.0–15.0)
MCH: 30.7 pg (ref 26.0–34.0)
MCHC: 33.2 g/dL (ref 30.0–36.0)
MCV: 92.4 fL (ref 78.0–100.0)
Platelets: 144 10*3/uL — ABNORMAL LOW (ref 150–400)
RBC: 2.51 MIL/uL — ABNORMAL LOW (ref 3.87–5.11)
RDW: 14.7 % (ref 11.5–15.5)
WBC: 17.4 10*3/uL — ABNORMAL HIGH (ref 4.0–10.5)

## 2015-03-15 MED ORDER — SCOPOLAMINE 1 MG/3DAYS TD PT72
MEDICATED_PATCH | TRANSDERMAL | Status: AC
  Filled 2015-03-15: qty 1

## 2015-03-15 MED ORDER — CEFAZOLIN SODIUM-DEXTROSE 2-3 GM-% IV SOLR
INTRAVENOUS | Status: AC
  Filled 2015-03-15: qty 50

## 2015-03-15 MED ORDER — LIDOCAINE HCL (CARDIAC) 20 MG/ML IV SOLN
INTRAVENOUS | Status: AC
  Filled 2015-03-15: qty 5

## 2015-03-15 MED ORDER — FENTANYL CITRATE (PF) 250 MCG/5ML IJ SOLN
INTRAMUSCULAR | Status: AC
  Filled 2015-03-15: qty 25

## 2015-03-15 MED ORDER — SUCCINYLCHOLINE CHLORIDE 20 MG/ML IJ SOLN
INTRAMUSCULAR | Status: AC
  Filled 2015-03-15: qty 1

## 2015-03-15 MED ORDER — PHENYLEPHRINE 40 MCG/ML (10ML) SYRINGE FOR IV PUSH (FOR BLOOD PRESSURE SUPPORT)
PREFILLED_SYRINGE | INTRAVENOUS | Status: AC
  Filled 2015-03-15: qty 20

## 2015-03-15 MED ORDER — RHO D IMMUNE GLOBULIN 1500 UNIT/2ML IJ SOSY
300.0000 ug | PREFILLED_SYRINGE | Freq: Once | INTRAMUSCULAR | Status: AC
  Administered 2015-03-15: 300 ug via INTRAMUSCULAR
  Filled 2015-03-15: qty 2

## 2015-03-15 MED ORDER — PROPOFOL 10 MG/ML IV BOLUS
INTRAVENOUS | Status: AC
  Filled 2015-03-15: qty 20

## 2015-03-15 MED ORDER — ONDANSETRON HCL 4 MG/2ML IJ SOLN
INTRAMUSCULAR | Status: AC
  Filled 2015-03-15: qty 2

## 2015-03-15 MED ORDER — MIDAZOLAM HCL 2 MG/2ML IJ SOLN
INTRAMUSCULAR | Status: AC
  Filled 2015-03-15: qty 4

## 2015-03-15 MED ORDER — METHYLERGONOVINE MALEATE 0.2 MG/ML IJ SOLN
INTRAMUSCULAR | Status: AC
  Filled 2015-03-15: qty 1

## 2015-03-15 NOTE — Op Note (Signed)
Autumn Porter, Porter NO.:  [id number]  MEDICAL RECORD NO.:  [id number]  LOCATION:  9143                          FACILITY:  [facility]  PHYSICIAN:  Philip Aspen, DO    DATE OF BIRTH:  1986/04/12  DATE OF PROCEDURE:  03/14/2015 DATE OF DISCHARGE:                              OPERATIVE REPORT   PREOPERATIVE DIAGNOSES:  Arrest of dilation and fetal intolerance of labor.  POSTOPERATIVE DIAGNOSES:  Arrest of dilation and fetal intolerance of labor.  PROCEDURE:  Low-transverse cesarean section.  SURGEON:  Philip Aspen, DO  ANESTHESIA:  Epidural.  IV FLUIDS:  3000 mL.  URINE OUTPUT:  150 mL.  BLOOD LOSS:  1200 mL.  SPECIMENS:  Cord blood to lab and cord blood for commercial banking, placenta.  FINDINGS:  Female infant in cephalic presentation with Apgars of 9 and 9 and weight 7 pounds and 11 ounces.  Normal uterus, tubes, and ovaries bilaterally.  COMPLICATIONS:  None.  CONDITION:  Stable to PACU.  DESCRIPTION OF PROCEDURE:  The patient had a protracted active phase of labor with multiple episode of fetal intolerance requiring Pitocin discontinuation.  The patient remained at 9 cm and we were unable to increase Pitocin to effect adequate contractions, therefore discussion of cesarean section was carried out, and the patient decided to proceed with C-section.  The patient was brought to the operating room, where epidural was bolused and found to be adequate.  She was prepped and draped in normal sterile fashion in dorsal supine position.  A Pfannenstiel skin incision was made with a scalpel and carried down to underlying layer of fascia with Bovie cautery.  The fascia was incised in the midline with a scalpel and extended laterally with Mayo scissors. Kocher clamps were placed at the superior aspect of the fascial incision and rectus muscles were dissected off bluntly and sharply.  Kocher clamps were then placed at the inferior aspect of the fascial  incision and again rectus muscles were dissected off bluntly and sharply.  The rectus muscles were separated at the midline with hemostat and peritoneum was entered bluntly.  The peritoneal incision was extended by manual traction laterally.  The abdomen was surveyed and found to be normal.  This was done manually.  The Alexis self retractor was placed and the vesicouterine peritoneum was identified, entered sharply with Metzenbaum scissors.  This incision was extended laterally with Metzenbaums and the bladder flap was created digitally.  A low- transverse cesarean incision was made with a scalpel and the amniotic sac bulged from the uterine incision and was entered sharply.  The incision was extended by cephalic and caudal traction.  The infant's head was deep in the pelvis.  With some difficulty, the infant's head was flexed, elevated, and delivered from the uterine incision.  The remainder of the infant's body was delivered without difficulty.  The cord was clamped and cut.  Infant was bulb suctioned.  The infant was handed off to awaiting Neonatology.  The patient desired commercial cord blood banking and after vial cord blood was collected for our lab, the needle was inserted in the umbilical cord to gravitational filling of the cord blood collection in  it.  This took approximately 15 seconds and this was handed off.  The cord was clamped and the placenta was then delivered manually.  The uterus was cleared of all clot and debris. Significant bleeding from the uterine incision was noted and were grasped with T clamps.  The incision was closed in first layer of 0- Vicryl running locked sutures.  Additional bleeding was noted at the right angle and a second layer of locking suture was placed from the right angle to the midline.  A third layer of imbrication was placed and an additional figure-of-eight was placed at the midline where approximately 1-cm extension inferiorly was noted.   Hemostasis was then achieved.  Significant clot and blood was removed from both gutters. The uterine incision was reexamined and found to be hemostatic.  The Alexis self retractor was removed.  Bladder blade was inserted and the incision was again examined and found to be hemostatic.  Both ovaries and tubes were normal.  The muscle, fascial, and subcutaneous tissues layers were examined, and no additional brisk bleeding was noted.  The peritoneum was closed with 4-0 Monocryl in a running fashion.  The fascia was reapproximated and closed with Vicryl in a running fashion. The subcutaneous tissue was irrigated, dried, and minimal use of Bovie cautery was needed for hemostasis.  The skin was reapproximated and closed with staples.  The patient tolerated the procedure well.  Sponge, lap, and needle counts were correct x2.  The patient was taken to recovery in stable condition.          ______________________________ Philip Aspen, DO     [place]/MEDQ  D:  03/14/2015  T:  03/15/2015  Job:  [id number]

## 2015-03-15 NOTE — Lactation Note (Signed)
This note was copied from the chart of Autumn Porter. Lactation Consultation Note  MOB called for assistance w/ latching.  Has been using NS.  Baby has high palate. Assisted mother w/ hand expression.  Gave baby 2ml of colostrum w/ spoon. Then helped mother compress breast and latch deep on breast.  Rhythmical sucks and swallows observed for more than 10 min. Did teachback w/ mother on how to compress breast to latch without NS if she desires. Encouraged STS and call for further assistance if needed.  Patient Name: Autumn Sunset Joshi [id number] Date: 03/15/2015 Reason for consult: Follow-up assessment   Maternal Data    Feeding Feeding Type: Breast Fed Length of feed: 30 min  LATCH Score/Interventions Latch: Repeated attempts needed to sustain latch, nipple held in mouth throughout feeding, stimulation needed to elicit sucking reflex. Intervention(s): Skin to skin;Waking techniques Intervention(s): Adjust position;Assist with latch;Breast massage;Breast compression  Audible Swallowing: A few with stimulation Intervention(s): Hand expression Intervention(s): Alternate breast massage  Type of Nipple: Everted at rest and after stimulation Intervention(s): Hand pump  Comfort (Breast/Nipple): Filling, red/small blisters or bruises, mild/mod discomfort  Problem noted: Mild/Moderate discomfort Interventions (Mild/moderate discomfort): Hand expression  Hold (Positioning): Assistance needed to correctly position infant at breast and maintain latch.  LATCH Score: 6  Lactation Tools Discussed/Used     Consult Status Consult Status: Follow-up Date: 03/16/15 Follow-up type: In-patient    Dahlia Byes [facility] 03/15/2015, 5:02 PM

## 2015-03-15 NOTE — Progress Notes (Signed)
Subjective: Postpartum Day 1: Cesarean Delivery Patient reports pain controlled, denies nausea and vomiting, ambulating without difficulty    Objective: Vital signs in last 24 hours: Temp:  [98 F (36.7 C)-98.9 F (37.2 C)] 98 F (36.7 C) (08/19 1610) Pulse Rate:  [78-89] 89 (08/19 0613) Resp:  [16-20] 16 (08/19 0613) BP: (90-97)/(46-64) 97/64 mmHg (08/19 0613) SpO2:  [96 %-97 %] 96 % (08/19 0140)  Physical Exam:  General: alert, cooperative and appears stated age Lochia: appropriate Uterine Fundus: firm Incision: healing well DVT Evaluation: No evidence of DVT seen on physical exam.   Recent Labs  03/13/15 0040 03/15/15 0540  HGB 13.6 7.7*  HCT 39.0 23.2*    Assessment/Plan: Status post Cesarean section. Doing well postoperatively.  Continue current care. Desires neonatal circumcision, R/B/A of procedure discussed at length. Pt understands that neonatal circumcision is not considered medically necessary and is elective. The risks include, but are not limited to bleeding, infection, damage to the penis, development of scar tissue, and having to have it redone at a later date. Pt understands theses risks and wishes to proceed   Autumn Porter H. 03/15/2015, 12:46 PM

## 2015-03-16 ENCOUNTER — Ambulatory Visit: Payer: Self-pay

## 2015-03-16 LAB — RH IG WORKUP (INCLUDES ABO/RH)
ABO/RH(D): A NEG
FETAL SCREEN: NEGATIVE
GESTATIONAL AGE(WKS): 41
Unit division: 0

## 2015-03-16 MED ORDER — OXYCODONE-ACETAMINOPHEN 5-325 MG PO TABS: 1.0000 | ORAL_TABLET | ORAL | Status: AC | PRN

## 2015-03-16 MED ORDER — IBUPROFEN 600 MG PO TABS: 600.0000 mg | ORAL_TABLET | Freq: Four times a day (QID) | ORAL | Status: AC | PRN

## 2015-03-16 MED ORDER — DOCUSATE SODIUM 100 MG PO CAPS: 100.0000 mg | ORAL_CAPSULE | Freq: Two times a day (BID) | ORAL | Status: AC

## 2015-03-16 NOTE — Discharge Summary (Signed)
Obstetric Discharge Summary Reason for Admission: induction of labor Prenatal Procedures: NST and ultrasound Intrapartum Procedures: cesarean: low cervical, transverse Postpartum Procedures: none Complications-Operative and Postpartum: none HEMOGLOBIN  Date Value Ref Range Status  03/15/2015 7.7* 12.0 - 15.0 g/dL Final   HCT  Date Value Ref Range Status  03/15/2015 23.2* 36.0 - 46.0 % Final    Physical Exam:  General: alert, cooperative and appears stated age 29: appropriate Uterine Fundus: firm Incision: healing well DVT Evaluation: No evidence of DVT seen on physical exam.  Discharge Diagnoses: Term Pregnancy-delivered  Discharge Information: Date: 03/16/2015 Activity: pelvic rest Diet: routine Medications: Ibuprofen, Colace and Percocet Condition: improved Instructions: refer to practice specific booklet Discharge to: home Follow-up Information    Follow up with CALLAHAN, SIDNEY, DO In 4 weeks.   Specialty:  Obstetrics and Gynecology   Why:  For a postpartum check   Contact information:   [address] [place] [address] [phone]       Newborn Data: Live born female  Birth Weight: 7 lb 11.8 oz (3510 g) APGAR: 9, 9  Home with mother.  Tynleigh Birt H. 03/16/2015, 11:49 AM

## 2015-03-16 NOTE — Lactation Note (Signed)
This note was copied from the chart of Autumn Alzina Arizmendi. Lactation Consultation Note: Mother describes lots of pain with feeding and latch. She states that her nipple were bleeding this am. Mother was given comfort gels by staff nurse. I suggested that mother allow me to assist with latch and father of baby states that they are ready to be discharged and he doesn't want to wake infant for feeding until they arrive home. Mother has a hand pump and electric pump. Lots of teaching with mother on proper latch, cluster feeding and post pumping. Mother was offered a follow with LC services. She will call as needed. Advised mother to phone Dr Tenny Craw for Rx to get all purpose nipple ointment to heal nipples.   Patient Name: Autumn Porter [id number] Date: 03/16/2015 Reason for consult: Follow-up assessment   Maternal Data    Feeding    LATCH Score/Interventions                      Lactation Tools Discussed/Used     Consult Status Consult Status: Complete    Michel Bickers 03/16/2015, 5:48 PM

## 2015-04-16 ENCOUNTER — Other Ambulatory Visit: Payer: Self-pay

## 2015-04-17 LAB — CYTOLOGY - PAP

## 2020-05-06 ENCOUNTER — Other Ambulatory Visit: Payer: BLUE CROSS/BLUE SHIELD
